# Patient Record
Sex: Male | Born: 2000 | Race: White | Hispanic: No | Marital: Single
Health system: Southern US, Community
[De-identification: ages and names within clinical notes are randomized; demographics above are authoritative.]

## PROBLEM LIST (undated history)

## (undated) HISTORY — PX: FOREARM SURGERY: SHX651

---

## 2019-04-30 ENCOUNTER — Encounter: Payer: Self-pay | Admitting: Emergency Medicine

## 2019-04-30 ENCOUNTER — Emergency Department
Admission: EM | Admit: 2019-04-30 | Discharge: 2019-04-30 | Disposition: A | Discharge status: Home / Self Care | Payer: BC Managed Care – PPO | Attending: Emergency Medicine | Admitting: Emergency Medicine

## 2019-04-30 ENCOUNTER — Emergency Department: Payer: BC Managed Care – PPO

## 2019-04-30 ENCOUNTER — Other Ambulatory Visit: Payer: Self-pay

## 2019-04-30 DIAGNOSIS — M25511 Pain in right shoulder: Secondary | ICD-10-CM | POA: Diagnosis not present

## 2019-04-30 MED ORDER — BACITRACIN-NEOMYCIN-POLYMYXIN 400-5-5000 EX OINT
TOPICAL_OINTMENT | CUTANEOUS | Status: AC
  Filled 2019-04-30: qty 1

## 2019-04-30 MED ORDER — MELOXICAM 15 MG PO TABS: 15.0000 mg | ORAL_TABLET | Freq: Every day | ORAL | 1 refills | Status: AC

## 2019-04-30 NOTE — ED Provider Notes (Signed)
[facility] Emergency Department Provider Note  ____________________________________________  Time seen: Approximately 10:38 PM  I have reviewed the triage vital signs and the nursing notes.   HISTORY  Chief Complaint Shoulder Pain    HPI Alan Sampson is a 18 y.o. male presents to the emergency department with acute right shoulder pain after patient fell while skateboarding.  Patient did not hit his head or neck.  He has had difficulty abducting his right upper extremity since injury occurred.  He denies numbness or tingling of the right upper extremity.  He denies chest pain, chest tightness, shortness of breath or abdominal pain.  No other alleviating measures have been attempted.        History reviewed. No pertinent past medical history.  There are no active problems to display for this patient.   Past Surgical History:  Procedure Laterality Date  . FOREARM SURGERY Right     Prior to Admission medications   Medication Sig Start Date End Date Taking? Authorizing Provider  meloxicam (MOBIC) 15 MG tablet Take 1 tablet (15 mg total) by mouth daily for 7 days. 04/30/19 05/07/19  Orvil FeilWoods, Dianna Deshler M, PA-C    Allergies Septra [sulfamethoxazole-trimethoprim]  No family history on file.  Social History Social History   Tobacco Use  . Smoking status: Not on file  Substance Use Topics  . Alcohol use: Not on file  . Drug use: Not on file     Review of Systems  Constitutional: No fever/chills Eyes: No visual changes. No discharge ENT: No upper respiratory complaints. Cardiovascular: no chest pain. Respiratory: no cough. No SOB. Gastrointestinal: No abdominal pain.  No nausea, no vomiting.  No diarrhea.  No constipation. Genitourinary: Negative for dysuria. No hematuria Musculoskeletal: Patient has right shoulder pain. Skin: Negative for rash, abrasions, lacerations, ecchymosis. Neurological: Negative for headaches, focal weakness or  numbness.   ____________________________________________   PHYSICAL EXAM:  VITAL SIGNS: ED Triage Vitals [04/30/19 2143]  Enc Vitals Group     BP (!) 153/126     Pulse Rate 70     Resp 18     Temp 98.4 F (36.9 C)     Temp Source Oral     SpO2 100 %     Weight 210 lb (95.3 kg)     Height 6' (1.829 m)     Head Circumference      Peak Flow      Pain Score      Pain Loc      Pain Edu?      Excl. in GC?      Constitutional: Alert and oriented. Well appearing and in no acute distress. Eyes: Conjunctivae are normal. PERRL. EOMI. Head: Atraumatic. Cardiovascular: Normal rate, regular rhythm. Normal S1 and S2.  Good peripheral circulation. Respiratory: Normal respiratory effort without tachypnea or retractions. Lungs CTAB. Good air entry to the bases with no decreased or absent breath sounds. Gastrointestinal: Bowel sounds 4 quadrants. Soft and nontender to palpation. No guarding or rigidity. No palpable masses. No distention. No CVA tenderness. Musculoskeletal: Patient is unable to perform full range of motion at the right shoulder.  He performs full range of motion at the right elbow and the right wrist.  He has no tenderness to palpation over the right clavicle or the right AC joint.  Patient has right rotator cuff weakness with testing.  Palpable radial pulse, right. Neurologic:  Normal speech and language. No gross focal neurologic deficits are appreciated.  Skin:  Skin is warm,  dry and intact. No rash noted. Psychiatric: Mood and affect are normal. Speech and behavior are normal. Patient exhibits appropriate insight and judgement.   ____________________________________________   LABS (all labs ordered are listed, but only abnormal results are displayed)  Labs Reviewed - No data to display ____________________________________________  EKG   ____________________________________________  RADIOLOGY I personally viewed and evaluated these images as part of my medical  decision making, as well as reviewing the written report by the radiologist.  Dg Shoulder Right  Result Date: 04/30/2019 CLINICAL DATA:  Fall, right shoulder pain, aerations EXAM: RIGHT SHOULDER - 2+ VIEW COMPARISON:  None. FINDINGS: There is no evidence of fracture or dislocation. There is no evidence of arthropathy or other focal bone abnormality. Soft tissues are unremarkable. Included portions of the right lung and chest wall are unremarkable. IMPRESSION: Negative. Electronically Signed   By: Lovena Le M.D.   On: 04/30/2019 22:08    ____________________________________________    PROCEDURES  Procedure(s) performed:    Procedures    Medications - No data to display   ____________________________________________   INITIAL IMPRESSION / ASSESSMENT AND PLAN / ED COURSE  Pertinent labs & imaging results that were available during my care of the patient were reviewed by me and considered in my medical decision making (see chart for details).  Review of the [place] CSRS was performed in accordance of the [place] prior to dispensing any controlled drugs.  Clinical Course as of Apr 29 2237  Mon Apr 30, 2019  2224 DG Shoulder Right [JW]    Clinical Course User Index [JW] Lannie Fields, PA-C          Assessment and plan Right shoulder pain 18 year old male presents to the emergency department with acute right shoulder pain after a fall.  Patient was hypertensive at triage but vital signs were otherwise reassuring.  Patient had difficulty performing abduction on physical exam and had right rotator cuff weakness with testing.  There is no significant tenderness to palpation over right clavicle or right AC joint.  X-ray examination of the right shoulder revealed no bony abnormality.  Patient was placed in a sling patient was discharged with meloxicam.  Referral was given orthopedics.  Return precautions were given.  All patient questions were  answered.    ____________________________________________  FINAL CLINICAL IMPRESSION(S) / ED DIAGNOSES  Final diagnoses:  Acute pain of right shoulder      NEW MEDICATIONS STARTED DURING THIS VISIT:  ED Discharge Orders         Ordered    meloxicam (MOBIC) 15 MG tablet  Daily     04/30/19 2235              This chart was dictated using voice recognition software/Dragon. Despite best efforts to proofread, errors can occur which can change the meaning. Any change was purely unintentional.    Lannie Fields, PA-C 04/30/19 2241    Duffy Bruce, MD 05/01/19 [phone]

## 2019-04-30 NOTE — ED Triage Notes (Signed)
First RN Note: Pt presents to ED via POV with c/o R shoulder pain, states fell off of a skate board earlier today, abrasion noted to R shoulder. Pt with possible deformity noted to R scapula area.

## 2019-04-30 NOTE — ED Notes (Signed)
Pt to the ER for pain to the right shoulder after a fall. Pt was on an electric skateboard and hit a rock. Skateboard stopped and he kept going. Pt has an abrasion to the posterior right shoulder and the right knee. Pt has painin the right shoulder, down the right arm and into the right elbow. Pt has swelling to the collarbone on the right side proximal to shoulder. Full ROM in fingers, wrist and elbow.

## 2019-04-30 NOTE — ED Triage Notes (Signed)
Patient ambulatory to triage with steady gait, without difficulty or distress noted, mask in place; pt reports PTA fell off skateboard; abrasion noted to rt shoulder with c/o pain to site; denies hitting head; swelling noted over clavicular area

## 2019-04-30 NOTE — ED Notes (Addendum)
Neosporin dressings applied to the right shoulder and right elbow. PA aware.

## 2019-05-10 ENCOUNTER — Other Ambulatory Visit: Payer: Self-pay | Admitting: *Deleted

## 2019-05-10 DIAGNOSIS — Z20822 Contact with and (suspected) exposure to covid-19: Secondary | ICD-10-CM

## 2019-05-11 LAB — NOVEL CORONAVIRUS, NAA: SARS-CoV-2, NAA: NOT DETECTED

## 2019-05-16 ENCOUNTER — Other Ambulatory Visit: Payer: Self-pay | Admitting: Orthopedic Surgery

## 2019-05-16 DIAGNOSIS — M25511 Pain in right shoulder: Secondary | ICD-10-CM

## 2019-05-22 ENCOUNTER — Ambulatory Visit
Admission: RE | Admit: 2019-05-22 | Discharge: 2019-05-22 | Disposition: A | Discharge status: Home / Self Care | Payer: BC Managed Care – PPO | Source: Ambulatory Visit | Attending: Orthopedic Surgery | Admitting: Orthopedic Surgery

## 2019-05-22 ENCOUNTER — Other Ambulatory Visit: Payer: Self-pay

## 2019-05-22 DIAGNOSIS — M25511 Pain in right shoulder: Secondary | ICD-10-CM | POA: Insufficient documentation

## 2019-05-29 ENCOUNTER — Ambulatory Visit: Payer: BC Managed Care – PPO

## 2020-06-27 ENCOUNTER — Encounter: Payer: Self-pay | Admitting: Emergency Medicine

## 2020-06-27 ENCOUNTER — Other Ambulatory Visit: Payer: Self-pay

## 2020-06-27 ENCOUNTER — Emergency Department
Admission: EM | Admit: 2020-06-27 | Discharge: 2020-06-27 | Disposition: A | Discharge status: Home / Self Care | Payer: BC Managed Care – PPO | Attending: Emergency Medicine | Admitting: Emergency Medicine

## 2020-06-27 ENCOUNTER — Emergency Department: Payer: BC Managed Care – PPO

## 2020-06-27 DIAGNOSIS — S52391A Other fracture of shaft of radius, right arm, initial encounter for closed fracture: Secondary | ICD-10-CM | POA: Diagnosis not present

## 2020-06-27 DIAGNOSIS — W228XXA Striking against or struck by other objects, initial encounter: Secondary | ICD-10-CM | POA: Diagnosis not present

## 2020-06-27 DIAGNOSIS — S4991XA Unspecified injury of right shoulder and upper arm, initial encounter: Secondary | ICD-10-CM | POA: Diagnosis present

## 2020-06-27 MED ORDER — HYDROCODONE-ACETAMINOPHEN 5-325 MG PO TABS: 1.0000 | ORAL_TABLET | ORAL | 0 refills | Status: AC | PRN

## 2020-06-27 MED ORDER — MELOXICAM 15 MG PO TABS: 15.0000 mg | ORAL_TABLET | Freq: Every day | ORAL | 0 refills | Status: AC

## 2020-06-27 MED ORDER — OXYCODONE HCL 5 MG PO TABS
5.0000 mg | ORAL_TABLET | Freq: Once | ORAL | Status: AC
  Administered 2020-06-27: 5 mg via ORAL
  Filled 2020-06-27: qty 1

## 2020-06-27 NOTE — ED Notes (Signed)
Alan Sampson to complete splint soon.
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
## 2020-06-27 NOTE — Discharge Instructions (Addendum)
Call orthopedics to schedule an appointment.

## 2020-06-27 NOTE — ED Notes (Signed)
Pt can move arm but not without severe pain. Pt can wiggle fingers. Slight swelling noted. Pt reports worst pain is at R fa. Visitor at bedside. Pt in NAD currently. Pulse at R wrist 2+. Hand warm.
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
## 2020-08-27 IMAGING — CR RIGHT SHOULDER - 2+ VIEW
1 series · 4 of 4 positions shown · non-contrast
Comparison: None.

CLINICAL DATA: Fall, right shoulder pain, aerations

EXAM:
RIGHT SHOULDER - 2+ VIEW

[Series 1: dg shoulder right · 0.14mm/px · 4 of 4 slices shown]
[im 1/4]
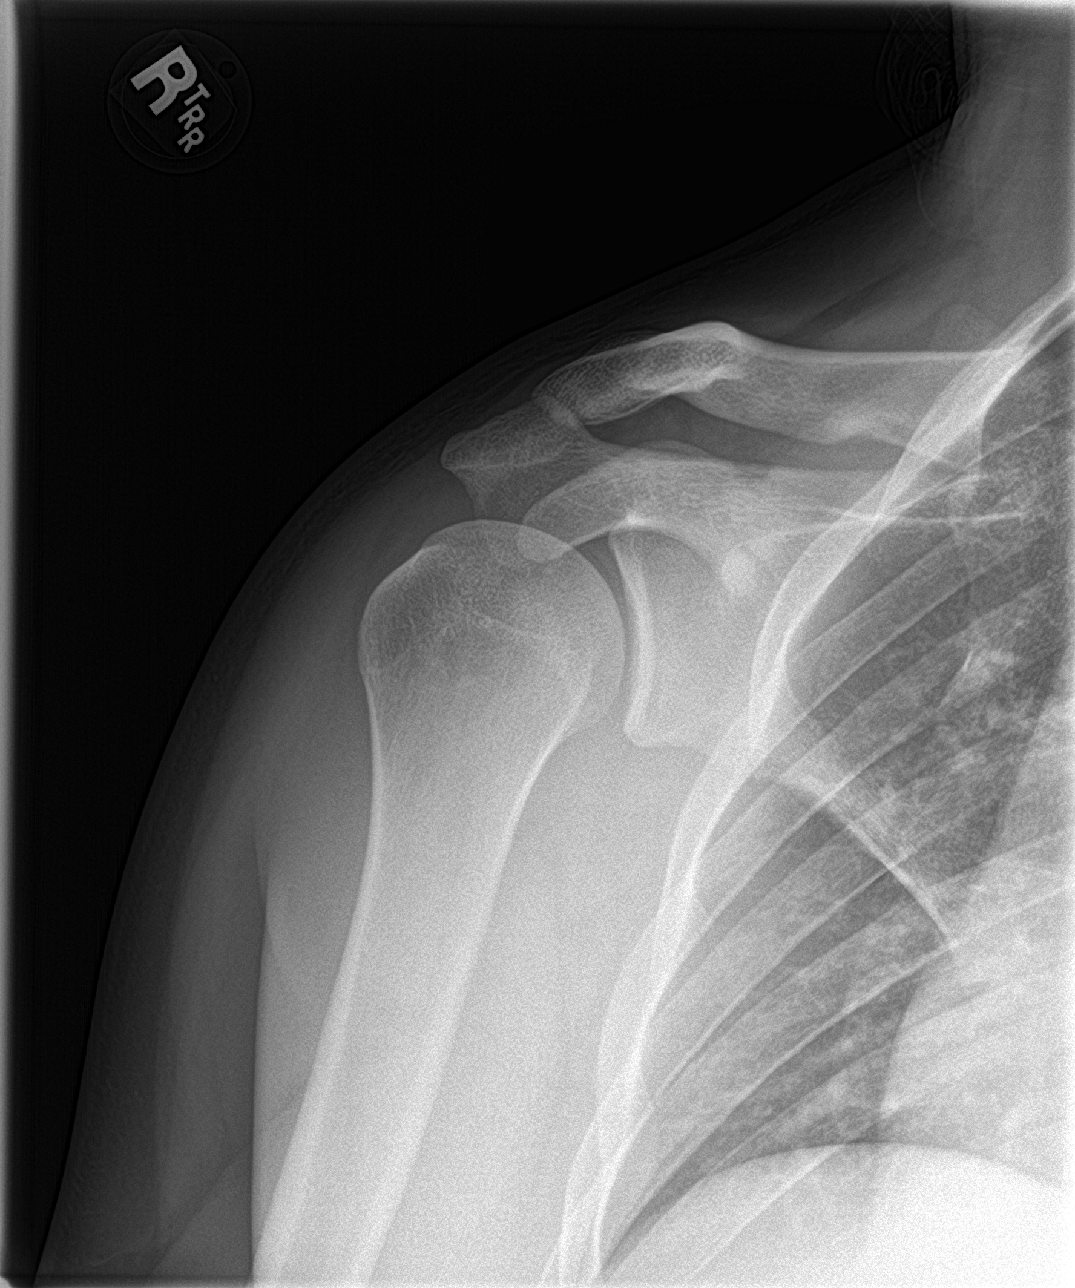
[im 2/4]
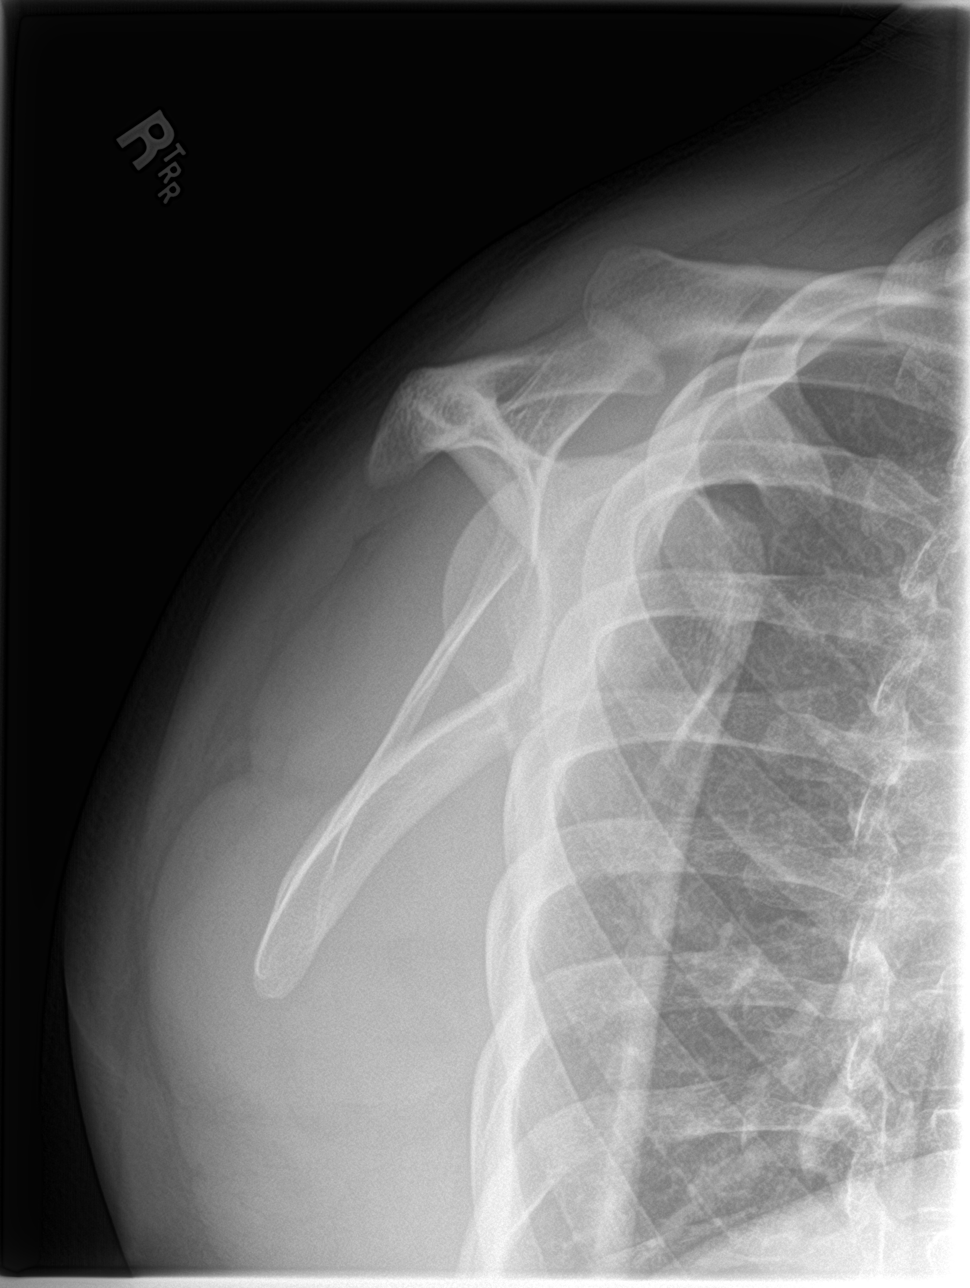
[im 3/4]
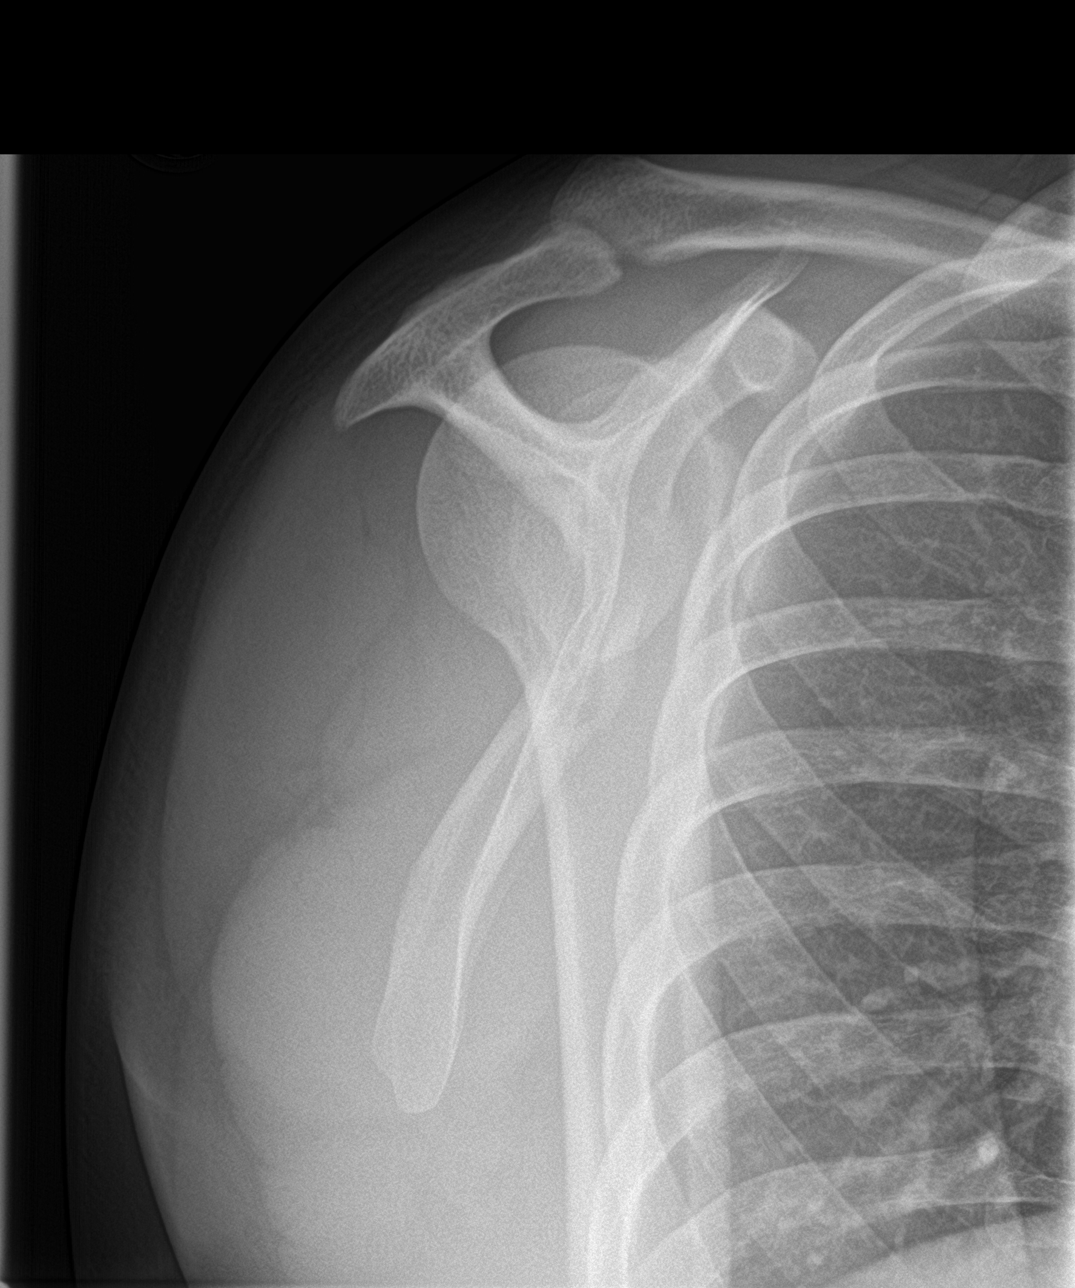
[im 4/4]
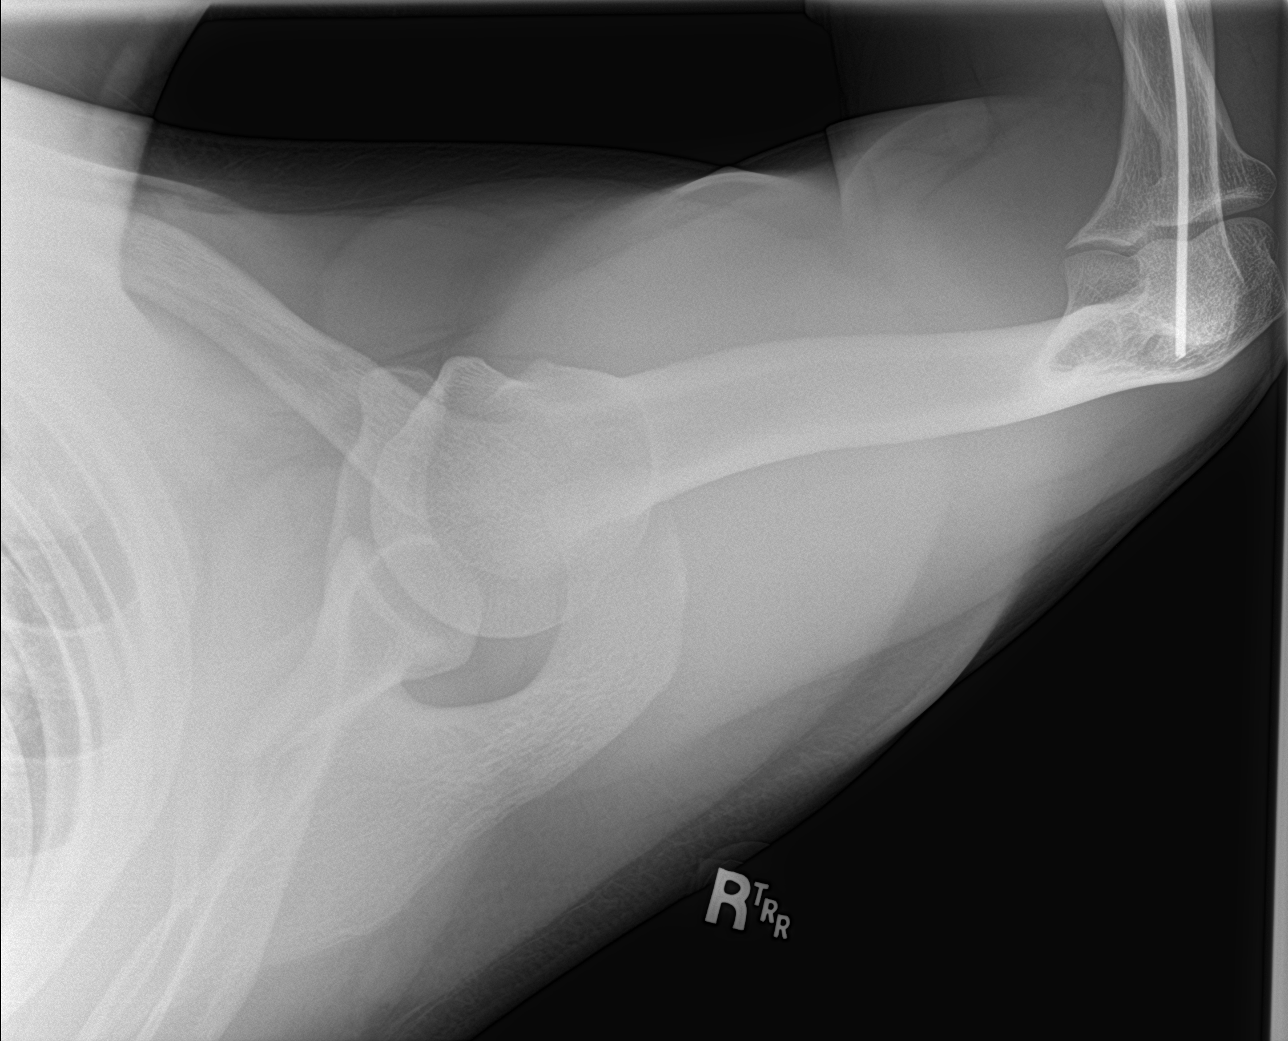

[4 of 4 positions shown; findings below may reference images not displayed]

FINDINGS: There is no evidence of fracture or dislocation. There is no
evidence of arthropathy or other focal bone abnormality. Soft
tissues are unremarkable. Included portions of the right lung and
chest wall are unremarkable.
IMPRESSION: Negative.

## 2020-09-18 IMAGING — MR MR SHOULDER*R* W/O CM
5 series · 33 of 40 positions shown · non-contrast
Comparison: Right shoulder x-rays dated April 30, 2019.

CLINICAL DATA: Right shoulder pain and limited range of motion
since falling off a skateboard 2 weeks ago.

EXAM:
MRI OF THE RIGHT SHOULDER WITHOUT CONTRAST
TECHNIQUE: Multiplanar, multisequence MR imaging of the shoulder was performed.
No intravenous contrast was administered.

[Series 5: PD fat-sat · axial · right · 4.0mm · 0.55mm/px · z∈[-42,+88]mm · 8 of 28 slices shown (1 of 2)]
[im 1/28]
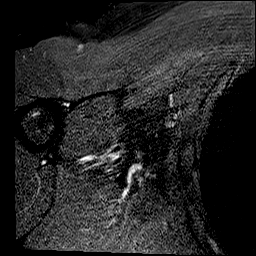
[im 4/28]
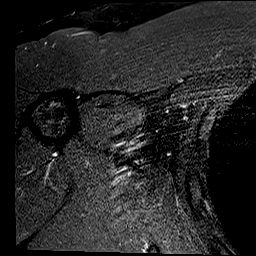
[im 10/28]
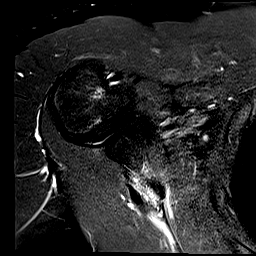
[im 13/28]
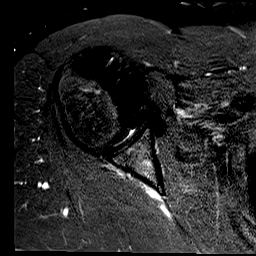
[im 16/28]
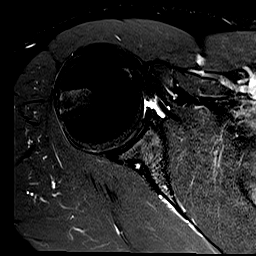
[im 19/28]
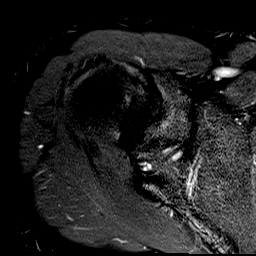
[im 25/28]
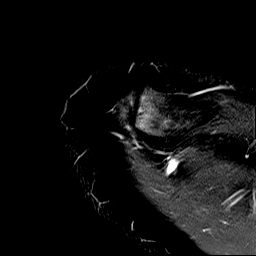
[im 28/28]
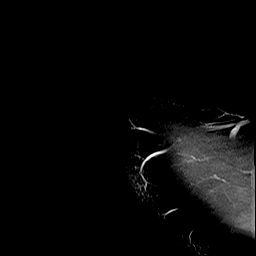

[Series 6: PD fat-sat · oblique · right · 4.0mm · 0.44mm/px · 8 of 26 slices shown (2 of 2)]
[im 1/26]
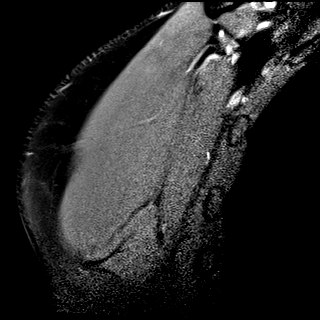
[im 4/26]
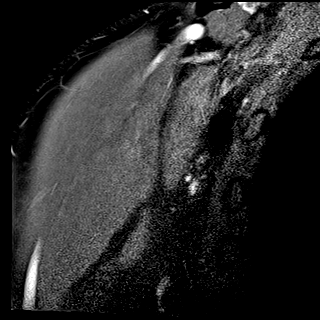
[im 8/26]
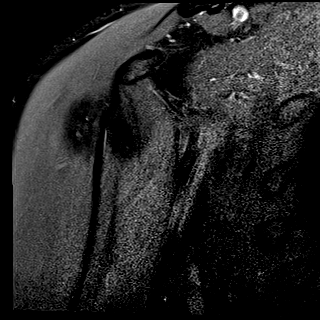
[im 11/26]
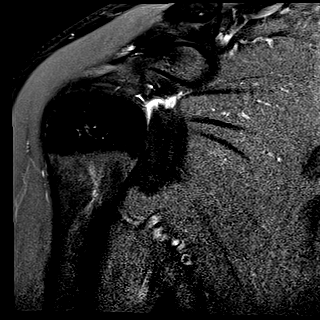
[im 15/26]
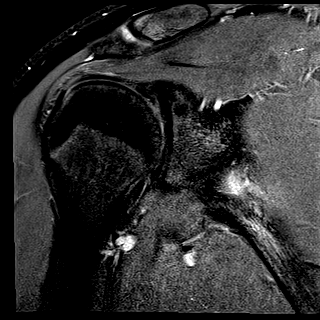
[im 18/26]
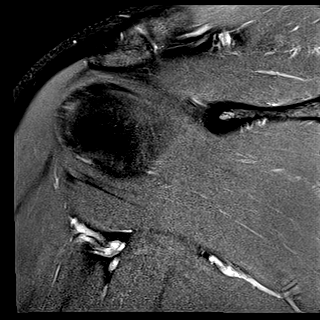
[im 22/26]
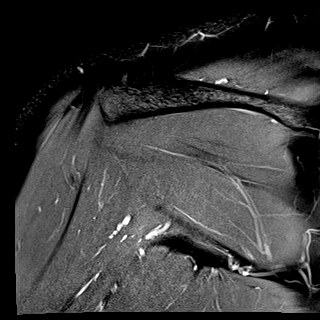
[im 26/26]
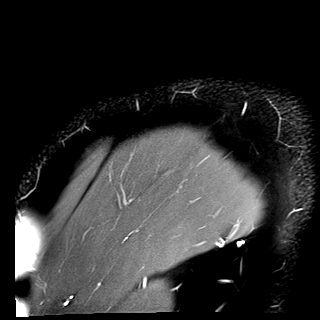

[Series 7: T2 fat-sat · oblique · right · 4.0mm · 0.44mm/px · 8 of 26 slices shown (1 of 2)]
[im 1/26]
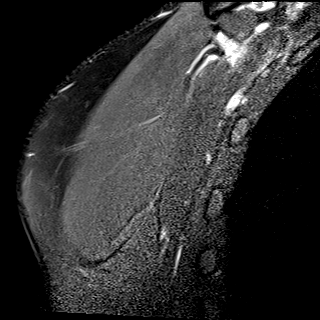
[im 4/26]
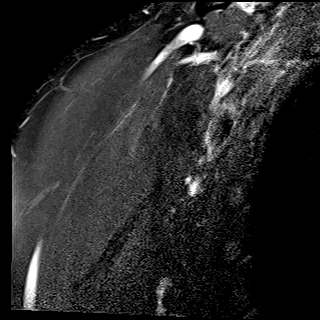
[im 8/26]
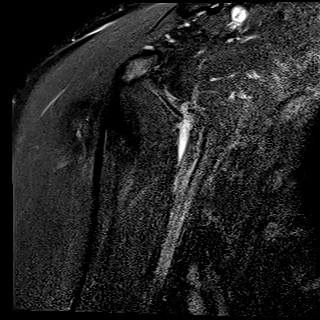
[im 11/26]
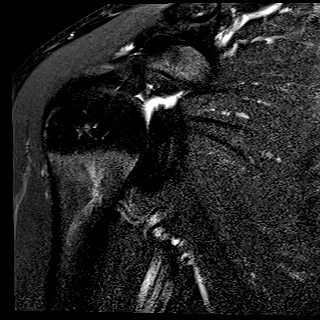
[im 15/26]
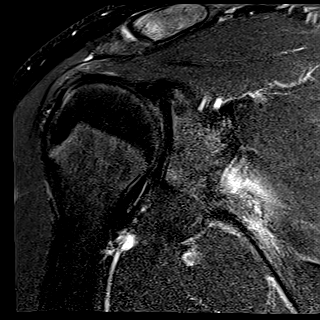
[im 18/26]
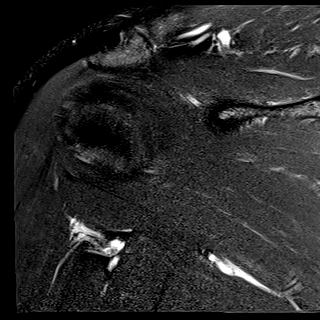
[im 22/26]
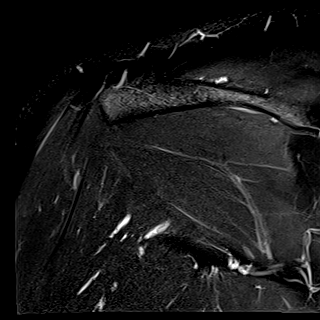
[im 26/26]
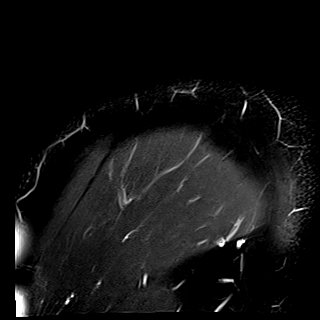

[Series 8: T2 fat-sat · oblique · right · 4.0mm · 0.23mm/px · 7 of 22 slices shown (2 of 2)]
[im 1/22]
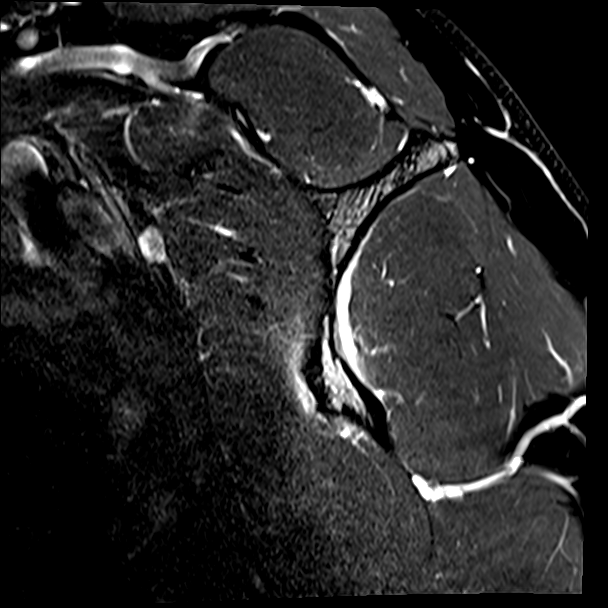
[im 4/22]
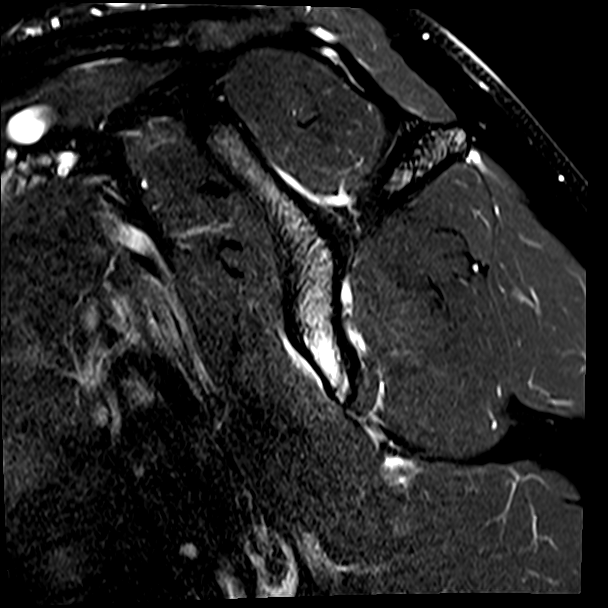
[im 8/22]
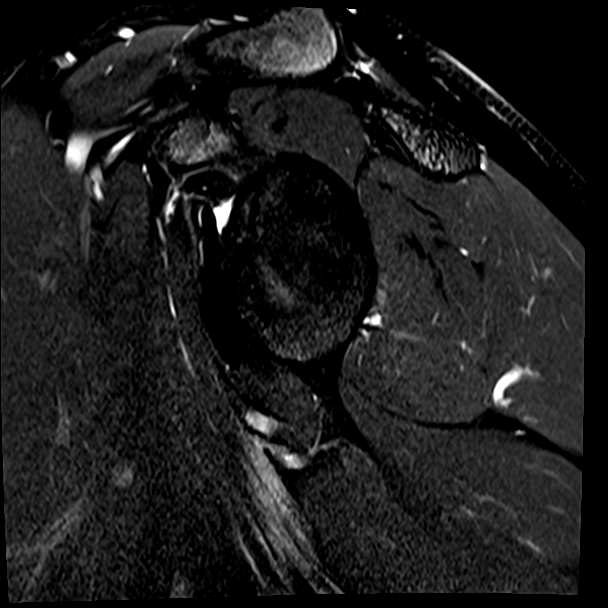
[im 11/22]
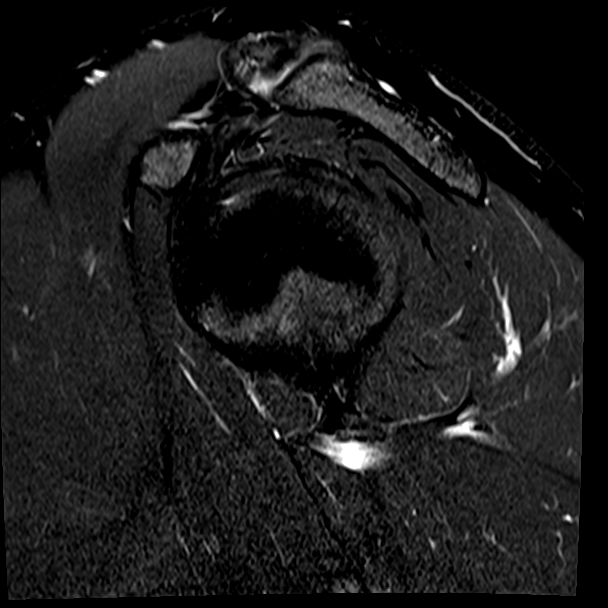
[im 15/22]
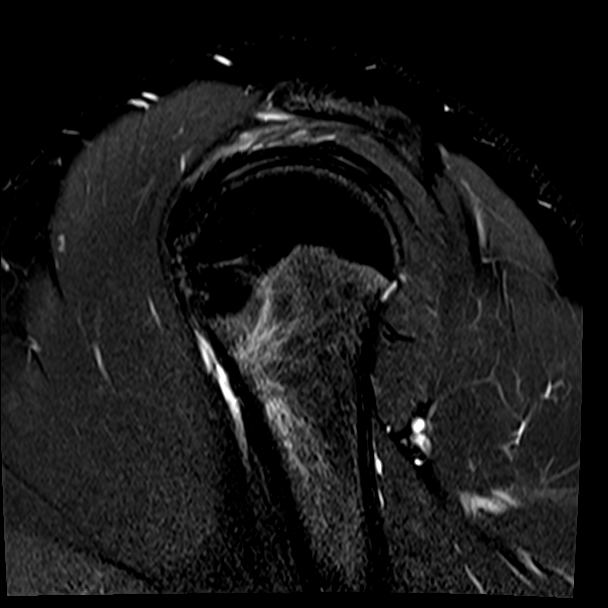
[im 18/22]
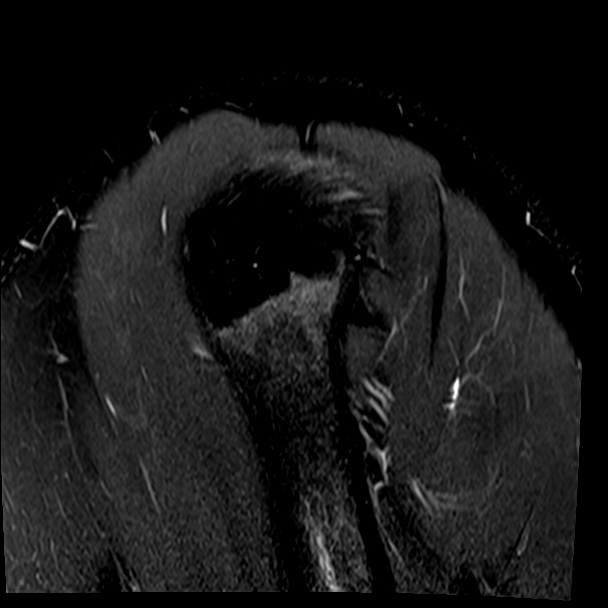
[im 22/22]
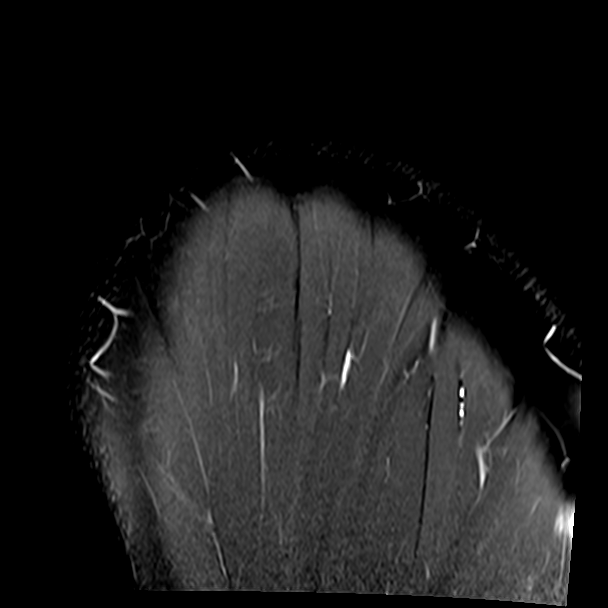

[Series 9: T1 · oblique · right · 4.0mm · 0.36mm/px · 2 of 22 slices shown]
[im 1/22]
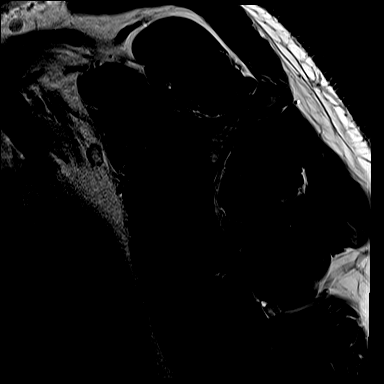
[im 4/22]
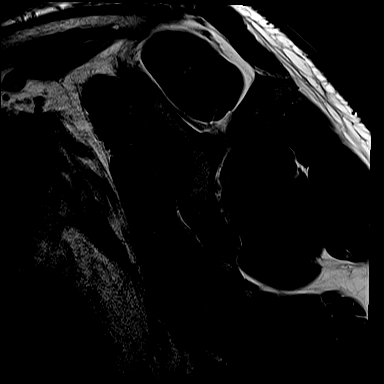

[33 of 40 positions shown; findings below may reference images not displayed]

FINDINGS: Rotator cuff:  Intact rotator cuff.

Muscles: No atrophy or abnormal signal of the muscles of the rotator
cuff.

Biceps long head:  Intact and normally positioned.

Acromioclavicular Joint: Normal acromioclavicular joint. Type II
acromion. No subacromial/subdeltoid bursal fluid.

Glenohumeral Joint: No joint effusion. No chondral defect.

Labrum: Grossly intact, but evaluation is limited by lack of
intraarticular fluid.

Bones: Acute nondisplaced fracture of the scapular body. No
involvement of the glenoid. No dislocation. No suspicious bone
lesion.

Other: None.
IMPRESSION: 1. Acute nondisplaced fracture of the scapular body.
